# Patient Record
Sex: Male | Born: 1990 | Race: White | Hispanic: No | Marital: Married | Smoking: Former smoker
Health system: Southern US, Community
[De-identification: ages and names within clinical notes are randomized; demographics above are authoritative.]

## PROBLEM LIST (undated history)

## (undated) DIAGNOSIS — G932 Benign intracranial hypertension: Secondary | ICD-10-CM

## (undated) DIAGNOSIS — R519 Headache, unspecified: Secondary | ICD-10-CM

## (undated) DIAGNOSIS — K219 Gastro-esophageal reflux disease without esophagitis: Secondary | ICD-10-CM

## (undated) DIAGNOSIS — F329 Major depressive disorder, single episode, unspecified: Secondary | ICD-10-CM

## (undated) DIAGNOSIS — R51 Headache: Secondary | ICD-10-CM

## (undated) DIAGNOSIS — F32A Depression, unspecified: Secondary | ICD-10-CM

## (undated) DIAGNOSIS — G43909 Migraine, unspecified, not intractable, without status migrainosus: Secondary | ICD-10-CM

## (undated) HISTORY — DX: Gastro-esophageal reflux disease without esophagitis: K21.9

## (undated) HISTORY — DX: Headache: R51

## (undated) HISTORY — DX: Migraine, unspecified, not intractable, without status migrainosus: G43.909

## (undated) HISTORY — DX: Headache, unspecified: R51.9

---

## 2016-04-17 ENCOUNTER — Encounter: Payer: Self-pay | Admitting: Emergency Medicine

## 2016-04-17 ENCOUNTER — Emergency Department
Admission: EM | Admit: 2016-04-17 | Discharge: 2016-04-17 | Disposition: A | Discharge status: Home / Self Care | Payer: 59 | Attending: Emergency Medicine | Admitting: Emergency Medicine

## 2016-04-17 ENCOUNTER — Emergency Department: Payer: 59

## 2016-04-17 DIAGNOSIS — Y9389 Activity, other specified: Secondary | ICD-10-CM | POA: Diagnosis not present

## 2016-04-17 DIAGNOSIS — M546 Pain in thoracic spine: Secondary | ICD-10-CM | POA: Insufficient documentation

## 2016-04-17 DIAGNOSIS — M79651 Pain in right thigh: Secondary | ICD-10-CM | POA: Insufficient documentation

## 2016-04-17 DIAGNOSIS — Y999 Unspecified external cause status: Secondary | ICD-10-CM | POA: Diagnosis not present

## 2016-04-17 DIAGNOSIS — S199XXA Unspecified injury of neck, initial encounter: Secondary | ICD-10-CM | POA: Diagnosis present

## 2016-04-17 DIAGNOSIS — M542 Cervicalgia: Secondary | ICD-10-CM | POA: Diagnosis not present

## 2016-04-17 DIAGNOSIS — Y92410 Unspecified street and highway as the place of occurrence of the external cause: Secondary | ICD-10-CM | POA: Insufficient documentation

## 2016-04-17 DIAGNOSIS — M7918 Myalgia, other site: Secondary | ICD-10-CM

## 2016-04-17 MED ORDER — MELOXICAM 15 MG PO TABS: 15.0000 mg | ORAL_TABLET | Freq: Every day | ORAL | 0 refills | Status: DC

## 2016-04-17 MED ORDER — CYCLOBENZAPRINE HCL 10 MG PO TABS: 10.0000 mg | ORAL_TABLET | Freq: Three times a day (TID) | ORAL | 0 refills | Status: DC | PRN
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
## 2016-04-17 NOTE — ED Notes (Signed)
Pt alert and oriented X4, active, cooperative, pt in NAD. RR even and unlabored, color WNL.    
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
## 2016-04-21 ENCOUNTER — Emergency Department: Payer: 59

## 2016-04-21 ENCOUNTER — Emergency Department
Admission: EM | Admit: 2016-04-21 | Discharge: 2016-04-21 | Disposition: A | Discharge status: Home / Self Care | Payer: 59 | Attending: Student in an Organized Health Care Education/Training Program | Admitting: Student in an Organized Health Care Education/Training Program

## 2016-04-21 DIAGNOSIS — S0990XS Unspecified injury of head, sequela: Secondary | ICD-10-CM | POA: Diagnosis present

## 2016-04-21 DIAGNOSIS — S060X0S Concussion without loss of consciousness, sequela: Secondary | ICD-10-CM | POA: Diagnosis not present

## 2016-04-21 MED ORDER — ACETAMINOPHEN 500 MG PO TABS: 1000.0000 mg | ORAL_TABLET | Freq: Once | ORAL | Status: DC

## 2016-04-21 MED ORDER — BUTALBITAL-APAP-CAFFEINE 50-325-40 MG PO TABS: 1.0000 | ORAL_TABLET | Freq: Four times a day (QID) | ORAL | 0 refills | Status: DC | PRN

## 2016-04-21 MED ORDER — ONDANSETRON HCL 4 MG PO TABS: 4.0000 mg | ORAL_TABLET | Freq: Every day | ORAL | 0 refills | Status: DC | PRN
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
## 2016-04-21 NOTE — ED Notes (Signed)
Spoke with Dr. Alphonzo LemmingsMcShane regarding pt sx. No further orders indicated at this time.

## 2016-07-29 DIAGNOSIS — R404 Transient alteration of awareness: Secondary | ICD-10-CM | POA: Insufficient documentation

## 2016-07-29 DIAGNOSIS — Z87820 Personal history of traumatic brain injury: Secondary | ICD-10-CM | POA: Insufficient documentation

## 2016-07-29 DIAGNOSIS — G44329 Chronic post-traumatic headache, not intractable: Secondary | ICD-10-CM | POA: Insufficient documentation

## 2016-07-29 DIAGNOSIS — H832X9 Labyrinthine dysfunction, unspecified ear: Secondary | ICD-10-CM | POA: Insufficient documentation

## 2016-10-27 ENCOUNTER — Emergency Department
Admission: EM | Admit: 2016-10-27 | Discharge: 2016-10-27 | Disposition: A | Discharge status: Home / Self Care | Payer: 59 | Attending: Emergency Medicine | Admitting: Emergency Medicine

## 2016-10-27 ENCOUNTER — Emergency Department: Payer: 59

## 2016-10-27 ENCOUNTER — Encounter: Payer: Self-pay | Admitting: Emergency Medicine

## 2016-10-27 DIAGNOSIS — R05 Cough: Secondary | ICD-10-CM | POA: Diagnosis present

## 2016-10-27 DIAGNOSIS — Z791 Long term (current) use of non-steroidal anti-inflammatories (NSAID): Secondary | ICD-10-CM | POA: Diagnosis not present

## 2016-10-27 DIAGNOSIS — J209 Acute bronchitis, unspecified: Secondary | ICD-10-CM | POA: Insufficient documentation

## 2016-10-27 LAB — POCT RAPID STREP A: STREPTOCOCCUS, GROUP A SCREEN (DIRECT): NEGATIVE

## 2016-10-27 MED ORDER — BENZONATATE 100 MG PO CAPS
100.0000 mg | ORAL_CAPSULE | Freq: Three times a day (TID) | ORAL | 0 refills | Status: AC | PRN
Start: 2016-10-27 — End: 2016-11-03

## 2016-10-27 MED ORDER — IPRATROPIUM-ALBUTEROL 0.5-2.5 (3) MG/3ML IN SOLN
3.0000 mL | Freq: Once | RESPIRATORY_TRACT | Status: AC
  Administered 2016-10-27: 3 mL via RESPIRATORY_TRACT
  Filled 2016-10-27: qty 3

## 2016-10-27 MED ORDER — PREDNISONE 50 MG PO TABS: ORAL_TABLET | ORAL | 0 refills | Status: DC

## 2016-10-27 MED ORDER — BENZONATATE 100 MG PO CAPS
200.0000 mg | ORAL_CAPSULE | Freq: Once | ORAL | Status: AC
  Administered 2016-10-27: 200 mg via ORAL
  Filled 2016-10-27: qty 2

## 2016-10-27 MED ORDER — AZITHROMYCIN 250 MG PO TABS: ORAL_TABLET | ORAL | 0 refills | Status: AC

## 2016-10-27 NOTE — ED Triage Notes (Signed)
Pt presents with cough and sore throat 

## 2016-10-27 NOTE — ED Provider Notes (Signed)
[facility] Emergency Department Provider Note  ____________________________________________  Time seen: Approximately 5:57 PM  I have reviewed the triage vital signs and the nursing notes.   HISTORY  Chief Complaint Cough    HPI Jared Burnett is a 26 y.o. male presents to emergency department with productive cough for clear sputum production for approximately 1 month. Patient states that he presents to the emergency department today because his cough has become so persistent that it is "making him vomit". Patient denies fever. He denies shortness of breath. He denies associated chest pain, chest tightness, nausea, and abdominal pain. He denies associated rhinorrhea and congestion. No alleviating measures have been attempted.   History reviewed. No pertinent past medical history.  There are no active problems to display for this patient.   History reviewed. No pertinent surgical history.  Prior to Admission medications   Medication Sig Start Date End Date Taking? Authorizing Provider  azithromycin (ZITHROMAX Z-PAK) 250 MG tablet Take 2 tablets (500 mg) on  Day 1,  followed by 1 tablet (250 mg) once daily on Days 2 through 5. 10/27/16 11/01/16  Orvil FeilWoods, Azrael Maddix M, PA-C  benzonatate (TESSALON PERLES) 100 MG capsule Take 1 capsule (100 mg total) by mouth 3 (three) times daily as needed for cough. 10/27/16 11/03/16  Orvil FeilWoods, Yolande Skoda M, PA-C  butalbital-acetaminophen-caffeine [place](FIORICET, ESGIC) [phone] MG tablet Take 1-2 tablets by mouth every 6 (six) hours as needed for headache. 04/21/16 04/21/17  Willy Eddyobinson, Patrick, MD  cyclobenzaprine (FLEXERIL) 10 MG tablet Take 1 tablet (10 mg total) by mouth 3 (three) times daily as needed for muscle spasms. 04/17/16   Triplett, Rulon Eisenmengerari B, FNP  meloxicam (MOBIC) 15 MG tablet Take 1 tablet (15 mg total) by mouth daily. 04/17/16   Triplett, Cari B, FNP  ondansetron (ZOFRAN) 4 MG tablet Take 1 tablet (4 mg total) by mouth daily as needed for nausea or  vomiting. 04/21/16 04/21/17  Willy Eddyobinson, Patrick, MD  predniSONE (DELTASONE) 50 MG tablet Take one tablet daily by mouth for the next five days. 10/27/16   Orvil FeilWoods, Murat Rideout M, PA-C    Allergies Patient has no known allergies.  No family history on file.  Social History Social History  Substance Use Topics  . Smoking status: Never Smoker  . Smokeless tobacco: Never Used  . Alcohol use Yes     Comment: OCC     Review of Systems  Constitutional: No fever/chills Eyes: No visual changes. No discharge ENT: No upper respiratory complaints. Cardiovascular: no chest pain. Respiratory: Patient has productive cough.  Gastrointestinal: Patient has posttussive emesis Musculoskeletal: Negative for musculoskeletal pain. Skin: Negative for rash, abrasions, lacerations, ecchymosis. Neurological: Negative for headaches, focal weakness or numbness.   ____________________________________________   PHYSICAL EXAM:  VITAL SIGNS: ED Triage Vitals  Enc Vitals Group     BP 10/27/16 1705 (!) 118/59     Pulse Rate 10/27/16 1705 (!) 106     Resp 10/27/16 1705 16     Temp 10/27/16 1705 98.7 F (37.1 C)     Temp Source 10/27/16 1705 Oral     SpO2 10/27/16 1705 95 %     Weight 10/27/16 1706 (!) 311 lb (141.1 kg)     Height 10/27/16 1706 5\' 7"  (1.702 m)     Head Circumference --      Peak Flow --      Pain Score 10/27/16 1707 4     Pain Loc --      Pain Edu? --  Excl. in GC? --      Constitutional: Alert and oriented. Well appearing and in no acute distress. Eyes: Conjunctivae are normal. PERRL. EOMI. Head: Atraumatic. ENT:      Ears: Tympanic membranes are pearly bilaterally.      Nose: No congestion/rhinnorhea.      Mouth/Throat: Mucous membranes are moist.  Neck: Full range of motion Hematological/Lymphatic/Immunilogical: Palpable cervical lymphadenopathy Cardiovascular: Normal rate, regular rhythm. Normal S1 and S2.  Good peripheral circulation. Respiratory: Normal respiratory effort  without tachypnea or retractions. Lungs CTAB. Good air entry to the bases with no decreased or absent breath sounds.  Skin:  Skin is warm, dry and intact. No rash noted. Psychiatric: Mood and affect are normal. Speech and behavior are normal. Patient exhibits appropriate insight and judgement.   ____________________________________________   LABS (all labs ordered are listed, but only abnormal results are displayed)  Labs Reviewed  CULTURE, GROUP A STREP [facility])  POCT RAPID STREP A   ____________________________________________  EKG   ____________________________________________  RADIOLOGY Geraldo Pitter, personally viewed and evaluated these images (plain radiographs) as part of my medical decision making, as well as reviewing the written report by the radiologist.   Dg Chest 2 View  Result Date: 10/27/2016 CLINICAL DATA:  Cough and sore throat for a few days EXAM: CHEST  2 VIEW COMPARISON:  None. FINDINGS: Prominent contour of the right atrium but overall normal heart size and vascular contours. Borderline central airway thickening/cuffing. No collapse or consolidation. No effusion or air leak. IMPRESSION: Suspect bronchitic type airway thickening.  Negative for pneumonia. Electronically Signed   By: Marnee Spring M.D.   On: 10/27/2016 18:06    ____________________________________________    PROCEDURES  Procedure(s) performed:    Procedures    Medications  benzonatate (TESSALON) capsule 200 mg (200 mg Oral Given 10/27/16 1804)  ipratropium-albuterol (DUONEB) 0.5-2.5 (3) MG/3ML nebulizer solution 3 mL (3 mLs Nebulization Given 10/27/16 1804)     ____________________________________________   INITIAL IMPRESSION / ASSESSMENT AND PLAN / ED COURSE  Pertinent labs & imaging results that were available during my care of the patient were reviewed by me and considered in my medical decision making (see chart for details).  Review of the [place] CSRS was performed in  accordance of the [facility] prior to dispensing any controlled drugs.     Assessment and plan Acute bronchitis Patient presents to the emergency department with productive cough for approximately 1 month. DG chest reveals findings consistent with acute bronchitis. Patient was discharged with azithromycin and prednisone. Patient was given a DuoNeb breathing treatment and Tessalon Perles in the emergency department. He was discharged with Jerilynn Som. He was advised to follow-up with his primary care provider in one week. All patient questions were answered.  ____________________________________________  FINAL CLINICAL IMPRESSION(S) / ED DIAGNOSES  Final diagnoses:  Acute bronchitis, unspecified organism      NEW MEDICATIONS STARTED DURING THIS VISIT:  New Prescriptions   AZITHROMYCIN (ZITHROMAX Z-PAK) 250 MG TABLET    Take 2 tablets (500 mg) on  Day 1,  followed by 1 tablet (250 mg) once daily on Days 2 through 5.   BENZONATATE (TESSALON PERLES) 100 MG CAPSULE    Take 1 capsule (100 mg total) by mouth 3 (three) times daily as needed for cough.   PREDNISONE (DELTASONE) 50 MG TABLET    Take one tablet daily by mouth for the next five days.        This chart was dictated using voice recognition  software/Dragon. Despite best efforts to proofread, errors can occur which can change the meaning. Any change was purely unintentional.    Orvil Feil, PA-C 10/27/16 1919    Sharman Cheek, MD 10/27/16 [phone]

## 2016-10-27 NOTE — ED Notes (Signed)
See triage note  Presents with cough and sore throat for couple of days  Afebrile on arrival

## 2016-10-30 LAB — CULTURE, GROUP A STREP (THRC)

## 2016-11-25 ENCOUNTER — Ambulatory Visit: Payer: 59 | Admitting: Family Medicine

## 2016-12-23 ENCOUNTER — Ambulatory Visit (INDEPENDENT_AMBULATORY_CARE_PROVIDER_SITE_OTHER): Payer: 59 | Admitting: Family

## 2016-12-23 ENCOUNTER — Telehealth: Payer: Self-pay | Admitting: *Deleted

## 2016-12-23 ENCOUNTER — Encounter: Payer: Self-pay | Admitting: Family

## 2016-12-23 VITALS — BP 124/86 | HR 100 | Temp 98.3°F | Ht 67.0 in | Wt 300.6 lb

## 2016-12-23 DIAGNOSIS — R05 Cough: Secondary | ICD-10-CM

## 2016-12-23 DIAGNOSIS — G932 Benign intracranial hypertension: Secondary | ICD-10-CM | POA: Insufficient documentation

## 2016-12-23 DIAGNOSIS — B351 Tinea unguium: Secondary | ICD-10-CM | POA: Diagnosis not present

## 2016-12-23 DIAGNOSIS — R059 Cough, unspecified: Secondary | ICD-10-CM | POA: Insufficient documentation

## 2016-12-23 MED ORDER — BUDESONIDE-FORMOTEROL FUMARATE 80-4.5 MCG/ACT IN AERO: 2.0000 | INHALATION_SPRAY | Freq: Two times a day (BID) | RESPIRATORY_TRACT | 1 refills | Status: DC

## 2016-12-23 NOTE — Patient Instructions (Addendum)
Trial of zantac, symbicort  Sleep study  Follow up one month

## 2016-12-23 NOTE — Assessment & Plan Note (Signed)
Afebrile. Well-appearing. No acute respiratory distress. Differentials include post viral cough, asthma, GERD. Trial of Zantac and Symbicort. Follow-up in one month. Pending chest x-ray. Discussed pulmonology referral at follow up.

## 2016-12-23 NOTE — Assessment & Plan Note (Signed)
Referral to podiatry for further evaluation, treatment.

## 2016-12-23 NOTE — Telephone Encounter (Signed)
Pt requested a call  Pt contact [phone]
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
## 2016-12-23 NOTE — Telephone Encounter (Signed)
Patient was informed to come in for xray. He stated he may or may not be able to have it done today.

## 2017-01-13 ENCOUNTER — Telehealth: Payer: Self-pay | Admitting: Family

## 2017-01-13 DIAGNOSIS — G4733 Obstructive sleep apnea (adult) (pediatric): Secondary | ICD-10-CM | POA: Insufficient documentation

## 2017-01-13 NOTE — Telephone Encounter (Signed)
Patient was already informed

## 2017-01-13 NOTE — Telephone Encounter (Signed)
Call pt Sleep study shows sleep apnea and cpap is recommended  I have ordered titration study

## 2017-01-18 ENCOUNTER — Ambulatory Visit: Payer: 59 | Admitting: Podiatry

## 2017-01-25 ENCOUNTER — Telehealth: Payer: Self-pay | Admitting: Family

## 2017-01-25 NOTE — Telephone Encounter (Signed)
Call pt  Your sleep study shows mild obstructive sleep apnea. He needs cipap machine  Please ensure he has been contacted and machine ordered

## 2017-01-25 NOTE — Telephone Encounter (Signed)
Left message for patient to return call back.  

## 2017-01-28 NOTE — Telephone Encounter (Signed)
Left message for patient to return call back.  

## 2017-01-29 NOTE — Telephone Encounter (Signed)
Letter has been mailed.

## 2017-08-29 ENCOUNTER — Other Ambulatory Visit: Payer: Self-pay

## 2017-08-29 ENCOUNTER — Emergency Department
Admission: EM | Admit: 2017-08-29 | Discharge: 2017-08-29 | Disposition: A | Discharge status: Home / Self Care | Payer: 59 | Attending: Emergency Medicine | Admitting: Emergency Medicine

## 2017-08-29 DIAGNOSIS — R05 Cough: Secondary | ICD-10-CM

## 2017-08-29 DIAGNOSIS — Z79899 Other long term (current) drug therapy: Secondary | ICD-10-CM | POA: Insufficient documentation

## 2017-08-29 DIAGNOSIS — R059 Cough, unspecified: Secondary | ICD-10-CM

## 2017-08-29 DIAGNOSIS — R569 Unspecified convulsions: Secondary | ICD-10-CM | POA: Insufficient documentation

## 2017-08-29 DIAGNOSIS — Z87891 Personal history of nicotine dependence: Secondary | ICD-10-CM | POA: Insufficient documentation

## 2017-08-29 LAB — CBC WITH DIFFERENTIAL/PLATELET
Basophils Absolute: 0.1 10*3/uL (ref 0–0.1)
Basophils Relative: 1 %
EOS ABS: 0.3 10*3/uL (ref 0–0.7)
EOS PCT: 2 %
HCT: 45.6 % (ref 40.0–52.0)
Hemoglobin: 15.2 g/dL (ref 13.0–18.0)
LYMPHS PCT: 30 %
Lymphs Abs: 3.5 10*3/uL (ref 1.0–3.6)
MCH: 28.2 pg (ref 26.0–34.0)
MCHC: 33.5 g/dL (ref 32.0–36.0)
MCV: 84.3 fL (ref 80.0–100.0)
MONO ABS: 1 10*3/uL (ref 0.2–1.0)
MONOS PCT: 9 %
Neutro Abs: 7 10*3/uL — ABNORMAL HIGH (ref 1.4–6.5)
Neutrophils Relative %: 58 %
PLATELETS: 298 10*3/uL (ref 150–440)
RBC: 5.41 MIL/uL (ref 4.40–5.90)
RDW: 14.2 % (ref 11.5–14.5)
WBC: 11.9 10*3/uL — AB (ref 3.8–10.6)

## 2017-08-29 LAB — COMPREHENSIVE METABOLIC PANEL
ALT: 23 U/L (ref 17–63)
AST: 21 U/L (ref 15–41)
Albumin: 4.5 g/dL (ref 3.5–5.0)
Alkaline Phosphatase: 86 U/L (ref 38–126)
Anion gap: 11 (ref 5–15)
BILIRUBIN TOTAL: 0.6 mg/dL (ref 0.3–1.2)
BUN: 12 mg/dL (ref 6–20)
CO2: 23 mmol/L (ref 22–32)
CREATININE: 0.81 mg/dL (ref 0.61–1.24)
Calcium: 9.3 mg/dL (ref 8.9–10.3)
Chloride: 106 mmol/L (ref 101–111)
Glucose, Bld: 91 mg/dL (ref 65–99)
POTASSIUM: 3.6 mmol/L (ref 3.5–5.1)
Sodium: 140 mmol/L (ref 135–145)
TOTAL PROTEIN: 8.5 g/dL — AB (ref 6.5–8.1)

## 2017-08-29 LAB — ETHANOL

## 2017-08-29 MED ORDER — BUDESONIDE-FORMOTEROL FUMARATE 80-4.5 MCG/ACT IN AERO: 2.0000 | INHALATION_SPRAY | Freq: Two times a day (BID) | RESPIRATORY_TRACT | 1 refills | Status: AC
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
## 2017-08-29 NOTE — Discharge Instructions (Signed)
Your lab tests today were normal.  The neurology clinic at [facility] would like you to call tomorrow to schedule a follow up appointment for an EEG to evaluate the possibility of seizures. Until then, you should not drive or operate dangerous machinery or go swimming or rock climbing.
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
## 2018-06-02 ENCOUNTER — Encounter: Payer: Self-pay | Admitting: Emergency Medicine

## 2018-06-02 ENCOUNTER — Other Ambulatory Visit: Payer: Self-pay

## 2018-06-02 DIAGNOSIS — F329 Major depressive disorder, single episode, unspecified: Secondary | ICD-10-CM | POA: Insufficient documentation

## 2018-06-02 DIAGNOSIS — R040 Epistaxis: Secondary | ICD-10-CM | POA: Insufficient documentation

## 2018-06-02 DIAGNOSIS — Z79899 Other long term (current) drug therapy: Secondary | ICD-10-CM | POA: Insufficient documentation

## 2018-06-02 DIAGNOSIS — Z87891 Personal history of nicotine dependence: Secondary | ICD-10-CM | POA: Insufficient documentation
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
## 2018-06-03 ENCOUNTER — Encounter: Payer: Self-pay | Admitting: Emergency Medicine

## 2018-06-03 ENCOUNTER — Emergency Department
Admission: EM | Admit: 2018-06-03 | Discharge: 2018-06-03 | Disposition: A | Discharge status: Home / Self Care | Payer: 59 | Attending: Emergency Medicine | Admitting: Emergency Medicine

## 2018-06-03 DIAGNOSIS — R040 Epistaxis: Secondary | ICD-10-CM

## 2018-06-03 HISTORY — DX: Major depressive disorder, single episode, unspecified: F32.9

## 2018-06-03 HISTORY — DX: Benign intracranial hypertension: G93.2

## 2018-06-03 HISTORY — DX: Depression, unspecified: F32.A

## 2018-06-03 NOTE — ED Notes (Addendum)
Pt states he does not have frequent nose bleeds, pt states he has constant headache that is not new for him.

## 2018-06-03 NOTE — ED Provider Notes (Signed)
[facility] Emergency Department Provider Note  ____________________________________________   First MD Initiated Contact with Patient 06/03/18 0120     (approximate)  I have reviewed the triage vital signs and the nursing notes.   HISTORY  Chief Complaint Epistaxis    HPI Jared Burnett is a 28 y.o. male with medical history as listed below who presents for evaluation of acute onset moderate right-sided epistaxis.  He reports no history of trauma although he admits he could have been picking his nose a little bit.  He said that the bleeding started with drops of blood and continued for about 15 minutes before it resolved on its own.  Pressure helped, nothing in particular made it worse.  After he bled for a little while he had one episode of vomiting but that is resolved and he has no persistent nausea.  Onset was acute.  He denies fever/chills, nasal congestion, runny nose (other than the epistaxis on the right side), chest pressure, chest pain, shortness of breath, persistent nausea, and abdominal pain.  He has had some medication changes recently associated with his headaches but he is not on any blood thinners.  He is not currently bleeding.  Past Medical History:  Diagnosis Date  . Depression   . Frequent headaches   . GERD (gastroesophageal reflux disease)   . Idiopathic intracranial hypertension    treated for IIH in the past, now his neurologist is questioning this diagnosis  . Migraines     Patient Active Problem List   Diagnosis Date Noted  . OSA (obstructive sleep apnea) 01/13/2017  . Cough 12/23/2016  . IIH (idiopathic intracranial hypertension) 12/23/2016  . Onychomycosis 12/23/2016  . Chronic post-traumatic headache, not intractable 07/29/2016  . History of concussion 07/29/2016  . Spell of altered consciousness 07/29/2016  . Vestibular dysfunction 07/29/2016    History reviewed. No pertinent surgical history.  Prior to Admission  medications   Medication Sig Start Date End Date Taking? Authorizing Provider  acetaZOLAMIDE (DIAMOX) 250 MG tablet Week 1: 1 tab twice a day. Week 2: 1 tab in the morning and 2 tabs at night. Week 3: 2 tabs twice day and continue this dose. 12/07/16   [provider]  budesonide-formoterol (SYMBICORT) 80-4.5 MCG/ACT inhaler Inhale into the lungs. 12/23/16   [provider]  budesonide-formoterol (SYMBICORT) 80-4.5 MCG/ACT inhaler Inhale 2 puffs into the lungs 2 (two) times daily. 08/29/17   Sharman Cheek, MD    Allergies Patient has no known allergies.  Family History  Problem Relation Age of Onset  . Alcohol abuse Mother   . Mental illness Mother   . Diabetes Maternal Grandmother   . Heart disease Maternal Grandfather   . Hyperlipidemia Maternal Grandfather   . Hypertension Maternal Grandfather   . Cancer Paternal Grandmother        breast    Social History Social History   Tobacco Use  . Smoking status: Former Games developer  . Smokeless tobacco: Never Used  Substance Use Topics  . Alcohol use: Yes    Comment: OCC  . Drug use: Yes    Types: Marijuana    Review of Systems Constitutional: No fever/chills Eyes: No visual changes. ENT: Right-sided epistaxis now resolved. Cardiovascular: Denies chest pain. Respiratory: Denies shortness of breath. Gastrointestinal: No abdominal pain.  One episode of vomiting during the nosebleed but no nausea.  No diarrhea.  No constipation. Genitourinary: Negative for dysuria. Musculoskeletal: Negative for neck pain.  Negative for back pain. Integumentary: Negative for rash.  Neurological: Negative for headaches, focal weakness or numbness.   ____________________________________________   PHYSICAL EXAM:  VITAL SIGNS: ED Triage Vitals  Enc Vitals Group     BP 06/02/18 2205 135/77     Pulse Rate 06/02/18 2205 92     Resp 06/02/18 2205 18     Temp 06/02/18 2205 97.9 F (36.6 C)     Temp Source 06/02/18 2205 Oral      SpO2 06/02/18 2205 97 %     Weight 06/02/18 2206 124.3 kg (274 lb)     Height 06/02/18 2206 1.702 m (5\' 7" )     Head Circumference --      Peak Flow --      Pain Score 06/02/18 2206 0     Pain Loc --      Pain Edu? --      Excl. in GC? --     Constitutional: Alert and oriented. Well appearing and in no acute distress. Eyes: Conjunctivae are normal.  Head: Atraumatic. Nose: No active epistaxis.  Left naris is normal in appearance.  Right naris appears irritated and there is a small ulceration that is relatively anterior.  No active bleeding, no clots. Mouth/Throat: Mucous membranes are moist. Cardiovascular: Normal rate, regular rhythm. Good peripheral circulation. Respiratory: Normal respiratory effort.  No retractions.  Gastrointestinal: Soft and nontender. No distention.  Musculoskeletal: No lower extremity tenderness nor edema. No gross deformities of extremities. Neurologic:  Normal speech and language. No gross focal neurologic deficits are appreciated.  Skin:  Skin is warm, dry and intact. No rash noted. Psychiatric: Mood and affect are normal. Speech and behavior are normal.  ____________________________________________   LABS (all labs ordered are listed, but only abnormal results are displayed)  Labs Reviewed - No data to display ____________________________________________  EKG  None - EKG not ordered by ED physician ____________________________________________  RADIOLOGY   ED MD interpretation: No indication for imaging  Official radiology report(s): No results found.  ____________________________________________   PROCEDURES  Critical Care performed: No   Procedure(s) performed:   Procedures   ____________________________________________   INITIAL IMPRESSION / ASSESSMENT AND PLAN / ED COURSE  As part of my medical decision making, I reviewed the following data within the electronic MEDICAL RECORD NUMBER Nursing notes reviewed and incorporated, Old  chart reviewed and Notes from prior ED visits    Differential diagnosis includes, but is not limited to, trauma, anterior bleed, posterior bleed, infection, dry nose.  The patient has a small ulceration on the right naris but there is no active bleeding and he has no known clotting issues.  There is no indication for emergent intervention at this time.  I had my usual customary epistaxis management recommendations and return precautions with the patient.  I am discharging him with a nose clip and he is going to stop by to get some Afrin on the way home.     ____________________________________________  FINAL CLINICAL IMPRESSION(S) / ED DIAGNOSES  Final diagnoses:  Right-sided epistaxis     MEDICATIONS GIVEN DURING THIS VISIT:  Medications - No data to display   ED Discharge Orders    None       Note:  This document was prepared using Dragon voice recognition software and may include unintentional dictation errors.   Loleta Rose, MD 06/03/18 [phone]

## 2018-06-03 NOTE — ED Notes (Signed)
ED Provider at bedside. 

## 2018-06-03 NOTE — Discharge Instructions (Addendum)

## 2021-04-05 ENCOUNTER — Emergency Department: Payer: Managed Care, Other (non HMO)

## 2021-04-05 ENCOUNTER — Other Ambulatory Visit: Payer: Self-pay

## 2021-04-05 DIAGNOSIS — W01198A Fall on same level from slipping, tripping and stumbling with subsequent striking against other object, initial encounter: Secondary | ICD-10-CM | POA: Insufficient documentation

## 2021-04-05 DIAGNOSIS — R55 Syncope and collapse: Secondary | ICD-10-CM | POA: Diagnosis not present

## 2021-04-05 DIAGNOSIS — Z87891 Personal history of nicotine dependence: Secondary | ICD-10-CM | POA: Insufficient documentation

## 2021-04-05 DIAGNOSIS — S0990XA Unspecified injury of head, initial encounter: Secondary | ICD-10-CM | POA: Diagnosis present

## 2021-04-05 LAB — BASIC METABOLIC PANEL
Anion gap: 10 (ref 5–15)
BUN: 19 mg/dL (ref 6–20)
CO2: 24 mmol/L (ref 22–32)
Calcium: 9 mg/dL (ref 8.9–10.3)
Chloride: 104 mmol/L (ref 98–111)
Creatinine, Ser: 1.07 mg/dL (ref 0.61–1.24)
GFR, Estimated: >60 mL/min (ref >60)
Glucose, Bld: 136 mg/dL — ABNORMAL HIGH (ref 70–99)
Potassium: 3.7 mmol/L (ref 3.5–5.1)
Sodium: 138 mmol/L (ref 135–145)

## 2021-04-05 LAB — CBC
HCT: 47.3 % (ref 39.0–52.0)
Hemoglobin: 15.3 g/dL (ref 13.0–17.0)
MCH: 27.8 pg (ref 26.0–34.0)
MCHC: 32.3 g/dL (ref 30.0–36.0)
MCV: 86 fL (ref 80.0–100.0)
Platelets: 302 10*3/uL (ref 150–400)
RBC: 5.5 MIL/uL (ref 4.22–5.81)
RDW: 13.8 % (ref 11.5–15.5)
WBC: 16.1 10*3/uL — ABNORMAL HIGH (ref 4.0–10.5)
nRBC: 0 % (ref 0.0–0.2)

## 2021-04-05 NOTE — ED Triage Notes (Signed)
First RN Note: Pt to ED via POV with his wife. Pt's wife reports pt took a CBD gummy at approx 1900, was standing at the counter and had +LOC lasting approx 30 seconds. Pt's wife reports patient hit head on counter. Pt alert and ambulatory to triage desk at this time.

## 2021-04-05 NOTE — ED Triage Notes (Signed)
Pt states he had a delta 8 gummy around 1900, states he wasn't feeling well and was in the kitchen. Pt states he woke up on floor having respiratory difficulty. Pt's wife states pt lost consciousness for approximately 30 seconds.  Pt's wife states pt fell straight back onto counter then floor and was choking as he regained consciousness.  Pt hit head on counter as he was falling down.  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  Pt denies nausea at this time.

## 2021-04-06 ENCOUNTER — Emergency Department
Admission: EM | Admit: 2021-04-06 | Discharge: 2021-04-06 | Disposition: A | Discharge status: Home / Self Care | Payer: Managed Care, Other (non HMO) | Attending: Emergency Medicine | Admitting: Emergency Medicine

## 2021-04-06 DIAGNOSIS — S0990XA Unspecified injury of head, initial encounter: Secondary | ICD-10-CM

## 2021-04-06 DIAGNOSIS — W19XXXA Unspecified fall, initial encounter: Secondary | ICD-10-CM

## 2021-04-06 NOTE — Discharge Instructions (Signed)
Please seek medical attention for any high fevers, chest pain, shortness of breath, change in behavior, persistent vomiting, bloody stool or any other new or concerning symptoms.  

## 2021-04-06 NOTE — ED Notes (Signed)
Pt took 1000 mg delta 8 gummy at 2045. Pt reports this is a larger dose than he had previously taken. Pt reports being told a funny joke, laughing excessively, feeling like he was unable to catch his breath, and losing consciousness. Pt did hit head and does have raised area to back R of head. 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 When asked if pt was dizzy pt states that he is unable to discern due to still being high.

## 2021-04-06 NOTE — ED Provider Notes (Signed)
[facility] Emergency Department Provider Note   ____________________________________________   I have reviewed the triage vital signs and the nursing notes.   HISTORY  Chief Complaint Fall   History limited by: Not Limited   HPI Jared Burnett is a 30 y.o. male who presents to the emergency department today because of concern for head injury. The patient states that he took a delta 8 gummy today. He then was laughing and passed out, hitting his head on a counter as he fell. The patient states that he has passed out when laughing in the past. It is thought he might have been passed out for roughly 30 seconds. Denies any other injuries. At the time of my exam feels essentially back to normal (save thinks he still feels the CBD gummy somewhat). No recent illness.   Records reviewed. Per medical record review patient has a history of concussion.  Past Medical History:  Diagnosis Date   Depression    Frequent headaches    GERD (gastroesophageal reflux disease)    Idiopathic intracranial hypertension    treated for IIH in the past, now his neurologist is questioning this diagnosis   Migraines     Patient Active Problem List   Diagnosis Date Noted   OSA (obstructive sleep apnea) 01/13/2017   Cough 12/23/2016   IIH (idiopathic intracranial hypertension) 12/23/2016   Onychomycosis 12/23/2016   Chronic post-traumatic headache, not intractable 07/29/2016   History of concussion 07/29/2016   Spell of altered consciousness 07/29/2016   Vestibular dysfunction 07/29/2016    History reviewed. No pertinent surgical history.  Prior to Admission medications   Medication Sig Start Date End Date Taking? Authorizing Provider  acetaZOLAMIDE (DIAMOX) 250 MG tablet Week 1: 1 tab twice a day. Week 2: 1 tab in the morning and 2 tabs at night. Week 3: 2 tabs twice day and continue this dose. 12/07/16   [provider]  budesonide-formoterol (SYMBICORT) 80-4.5 MCG/ACT  inhaler Inhale into the lungs. 12/23/16   [provider]  budesonide-formoterol (SYMBICORT) 80-4.5 MCG/ACT inhaler Inhale 2 puffs into the lungs 2 (two) times daily. 08/29/17   Sharman Cheek, MD    Allergies Patient has no known allergies.  Family History  Problem Relation Age of Onset   Alcohol abuse Mother    Mental illness Mother    Diabetes Maternal Grandmother    Heart disease Maternal Grandfather    Hyperlipidemia Maternal Grandfather    Hypertension Maternal Grandfather    Cancer Paternal Grandmother        breast    Social History Social History   Tobacco Use   Smoking status: Former   Smokeless tobacco: Never  Building services engineer Use: Never used  Substance Use Topics   Alcohol use: Yes    Comment: OCC   Drug use: Yes    Types: Marijuana    Review of Systems Constitutional: No fever/chills Eyes: No visual changes. ENT: No sore throat. Cardiovascular: Denies chest pain. Respiratory: Denies shortness of breath. Gastrointestinal: No abdominal pain.  No nausea, no vomiting.  No diarrhea.   Genitourinary: Negative for dysuria. Musculoskeletal: Negative for back pain. Skin: Negative for rash. Neurological: Positive for syncopal episode.   ____________________________________________   PHYSICAL EXAM:  VITAL SIGNS: ED Triage Vitals  Enc Vitals Group     BP 04/05/21 2117 114/72     Pulse Rate 04/05/21 2117 (!) 105     Resp 04/05/21 2117 18     Temp 04/05/21 2116 (!) 97.5  F (36.4 C)     Temp Source 04/05/21 2116 Oral     SpO2 04/05/21 2117 92 %     Weight 04/05/21 2124 280 lb (127 kg)     Height 04/05/21 2124 5\' 7"  (1.702 m)     Head Circumference --      Peak Flow --      Pain Score 04/05/21 2124 7   Constitutional: Alert and oriented.  Eyes: Conjunctivae are normal.  ENT      Head: Normocephalic and atraumatic.      Nose: No congestion/rhinnorhea.      Mouth/Throat: Mucous membranes are moist.      Neck: No stridor. No midline  tenderness Hematological/Lymphatic/Immunilogical: No cervical lymphadenopathy. Cardiovascular: Normal rate, regular rhythm.  No murmurs, rubs, or gallops.  Respiratory: Normal respiratory effort without tachypnea nor retractions. Breath sounds are clear and equal bilaterally. No wheezes/rales/rhonchi. Gastrointestinal: Soft and non tender. No rebound. No guarding.  Genitourinary: Deferred Musculoskeletal: Normal range of motion in all extremities. No lower extremity edema. Neurologic:  Normal speech and language. No gross focal neurologic deficits are appreciated.  Skin:  Skin is warm, dry and intact. No rash noted. Psychiatric: Mood and affect are normal. Speech and behavior are normal. Patient exhibits appropriate insight and judgment.  ____________________________________________    LABS (pertinent positives/negatives)  CBC wbc 16.1, hgb 15.3, plt 302 BMP wnl except glu 136  ____________________________________________   EKG  I, 2125, attending physician, personally viewed and interpreted this EKG  EKG Time: 2257 Rate: 113 Rhythm: sinus tachycardia Axis: normal Intervals: qtc 471 QRS: narrow ST changes: no st elevation Impression: abnormal ekg  ____________________________________________    RADIOLOGY  CT head No acute abnormality  ____________________________________________   PROCEDURES  Procedures  ____________________________________________   INITIAL IMPRESSION / ASSESSMENT AND PLAN / ED COURSE  Pertinent labs & imaging results that were available during my care of the patient were reviewed by me and considered in my medical decision making (see chart for details).   Patient presented to the emergency department today after a syncopal episode and hitting his head on a counter.  CT head without any concerning abnormalities.  Patient states he does feel essentially back to normal at the time my exam.  This time will plan on discharging.  Did  discuss brain rest with the patient.  ____________________________________________   FINAL CLINICAL IMPRESSION(S) / ED DIAGNOSES  Final diagnoses:  Fall, initial encounter  Injury of head, initial encounter     Note: This dictation was prepared with 2258 dictation. Any transcriptional errors that result from this process are unintentional     Nurse, children's, MD 04/06/21 [phone]

## 2023-05-19 IMAGING — CT CT HEAD W/O CM
4 series · 16 of 47 positions shown, 18 images · non-contrast
Comparison: 04/21/2016

CLINICAL DATA: Recent delta 8 use with respiratory difficulty and
fall, initial encounter

EXAM:
CT HEAD WITHOUT CONTRAST
TECHNIQUE: Contiguous axial images were obtained from the base of the skull
through the vertex without intravenous contrast.

[Series 2: head bone · axial · 0.46mm/px · z∈[+591,+623]mm · 3 of 83 slices shown]
[im 9/83  bone]
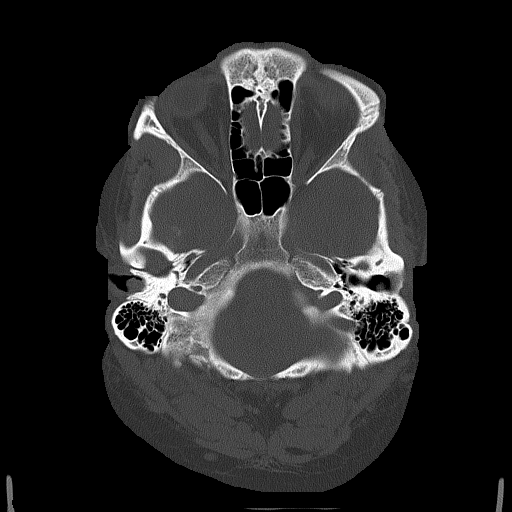
[im 17/83  bone]
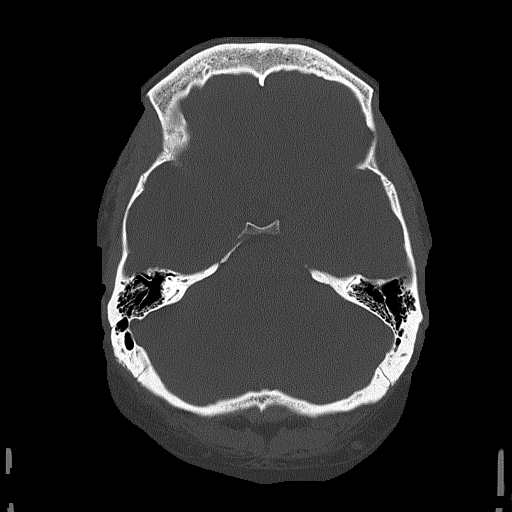
[im 25/83  bone]
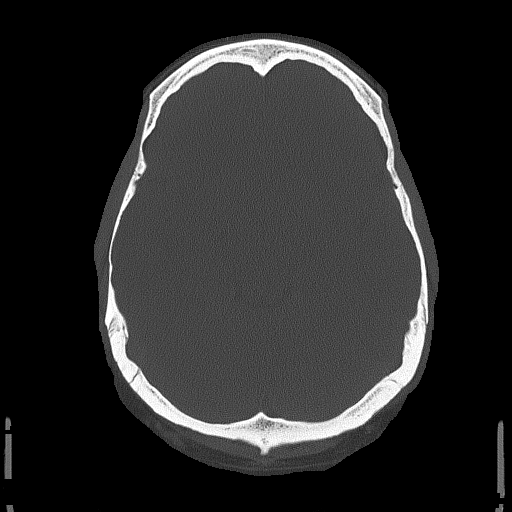

[Series 3: head wo · axial · 0.46mm/px · z∈[+595,+715]mm · 7 of 33 slices shown, 9 images]
[im 5/33  brain]
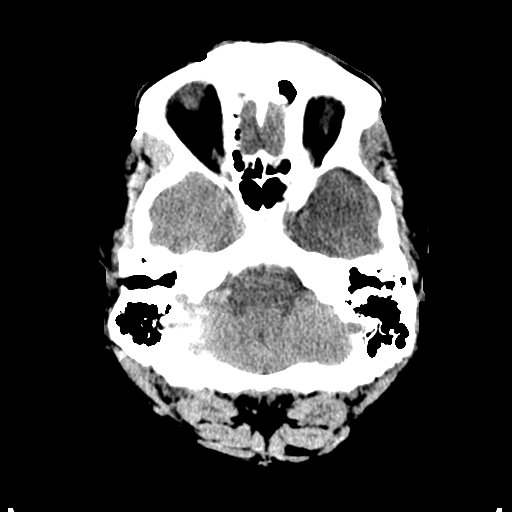
[im 5/33  bone]
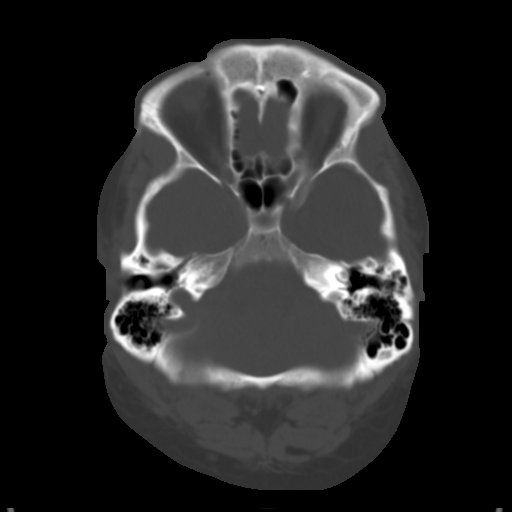
[im 9/33  brain]
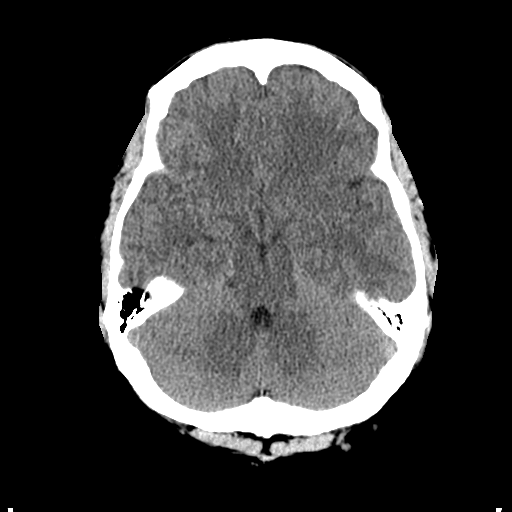
[im 13/33  brain]
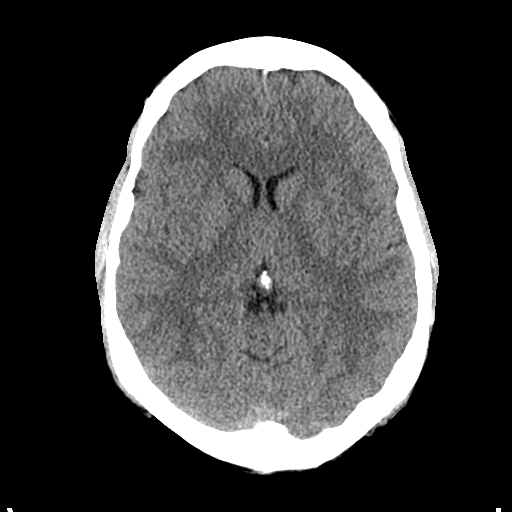
[im 17/33  brain]
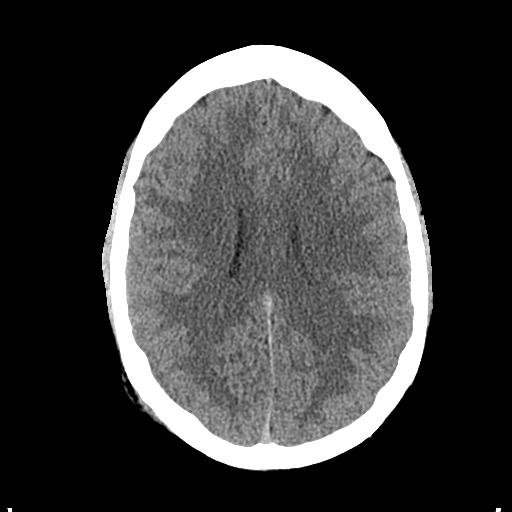
[im 21/33  brain]
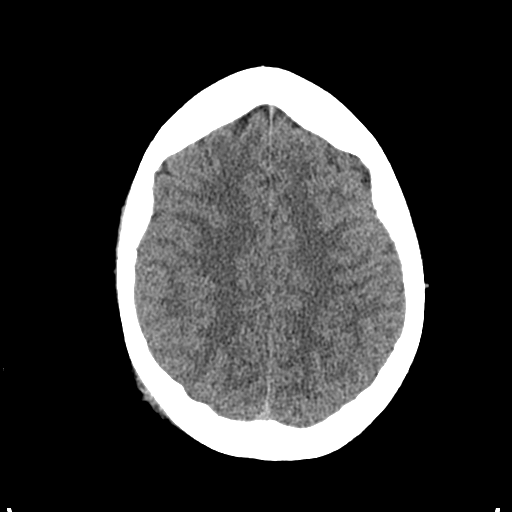
[im 21/33  bone]
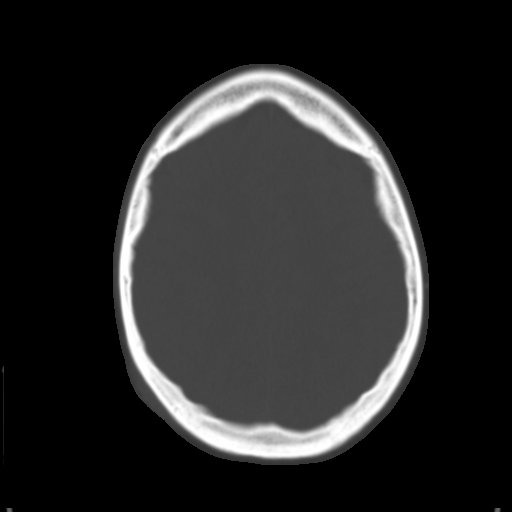
[im 25/33  brain]
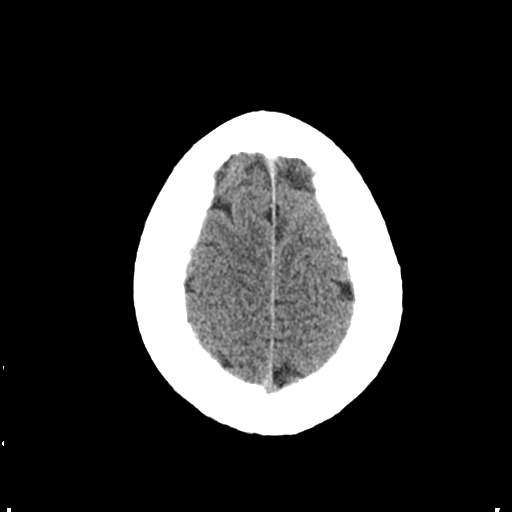
[im 29/33  brain]
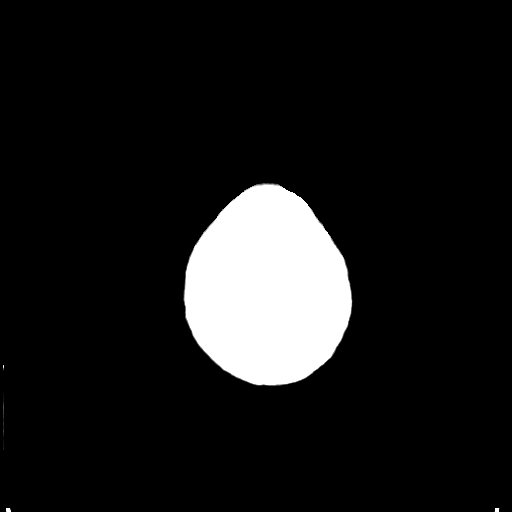

[Series 4: coronal soft tissue · coronal · 0.35mm/px · 3 of 75 slices shown]
[im 25/75  brain]
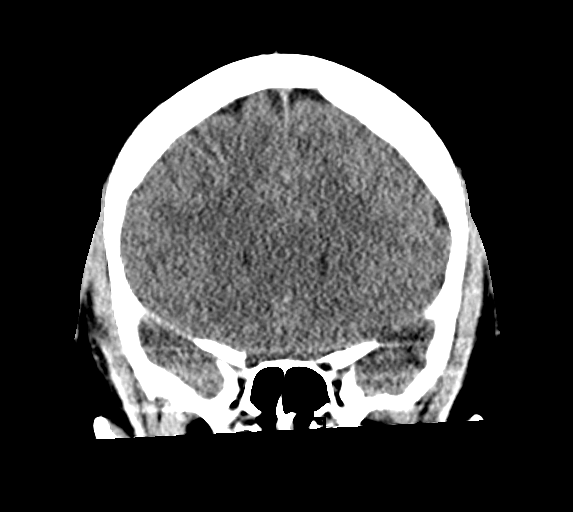
[im 33/75  brain]
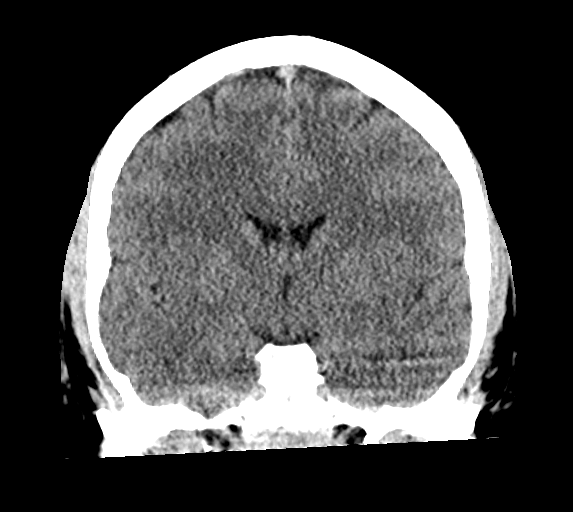
[im 42/75  brain]
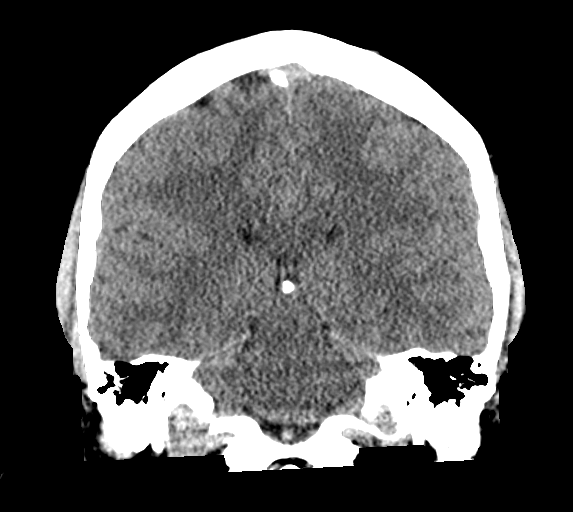

[Series 5: sagittal soft tissue · sagittal · 0.35mm/px · 3 of 67 slices shown]
[im 23/67  brain]
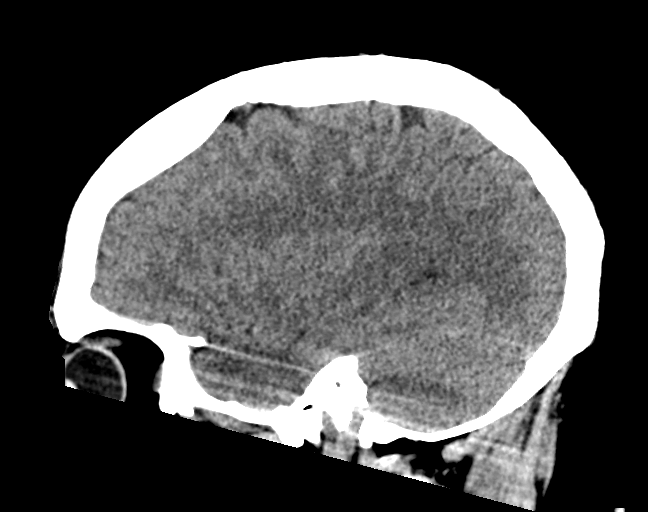
[im 34/67  brain]
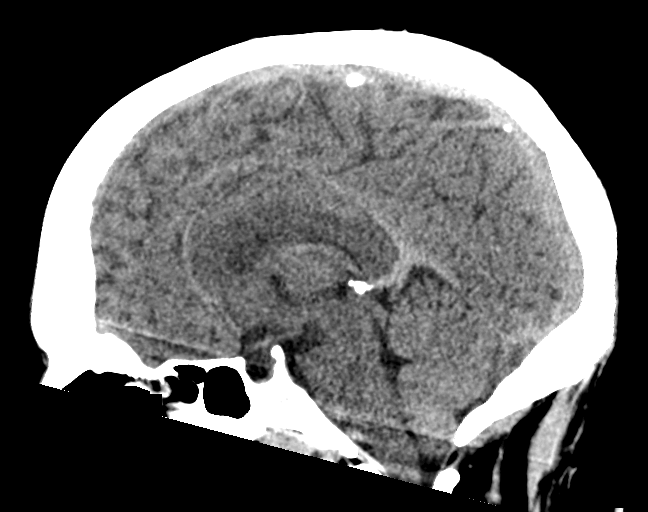
[im 45/67  brain]
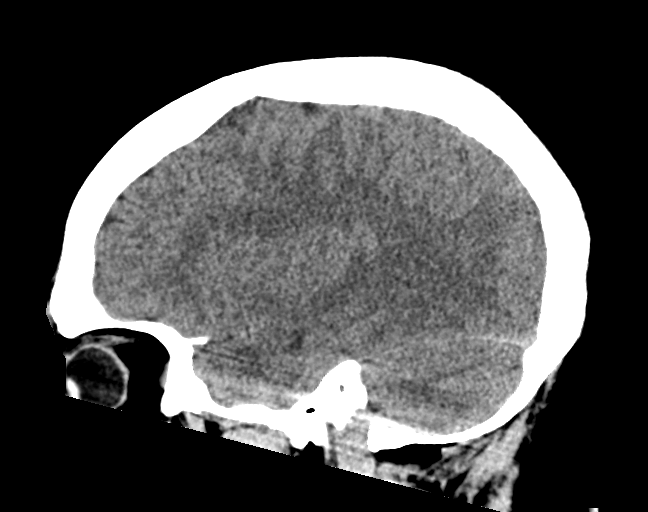

[16 of 47 positions shown; findings below may reference images not displayed]

FINDINGS: Brain: No evidence of acute infarction, hemorrhage, hydrocephalus,
extra-axial collection or mass lesion/mass effect.

Vascular: No hyperdense vessel or unexpected calcification.

Skull: Normal. Negative for fracture or focal lesion.

Sinuses/Orbits: No acute finding.

Other: None.
IMPRESSION: No acute intracranial abnormality noted.
